# Patient Record
Sex: Male | Born: 1960 | Race: White | Hispanic: No | Smoking: Current every day smoker
Health system: Southern US, Community
[De-identification: ages and names within clinical notes are randomized; demographics above are authoritative.]

## PROBLEM LIST (undated history)

## (undated) DIAGNOSIS — H409 Unspecified glaucoma: Secondary | ICD-10-CM

## (undated) DIAGNOSIS — M549 Dorsalgia, unspecified: Secondary | ICD-10-CM

---

## 2005-05-28 ENCOUNTER — Emergency Department (HOSPITAL_COMMUNITY): Admission: EM | Admit: 2005-05-28 | Discharge: 2005-05-28 | Payer: Self-pay | Admitting: Emergency Medicine

## 2007-11-05 ENCOUNTER — Emergency Department (HOSPITAL_COMMUNITY): Admission: EM | Admit: 2007-11-05 | Discharge: 2007-11-05 | Payer: Self-pay | Admitting: Emergency Medicine

## 2008-02-23 ENCOUNTER — Emergency Department (HOSPITAL_COMMUNITY): Admission: EM | Admit: 2008-02-23 | Discharge: 2008-02-23 | Payer: Self-pay | Admitting: Emergency Medicine

## 2008-04-02 ENCOUNTER — Encounter: Admission: RE | Admit: 2008-04-02 | Discharge: 2008-04-02 | Payer: Self-pay | Admitting: Neurology

## 2008-05-07 ENCOUNTER — Encounter: Admission: RE | Admit: 2008-05-07 | Discharge: 2008-05-07 | Payer: Self-pay | Admitting: Neurology

## 2009-02-20 ENCOUNTER — Emergency Department (HOSPITAL_COMMUNITY): Admission: EM | Admit: 2009-02-20 | Discharge: 2009-02-20 | Payer: Self-pay | Admitting: Emergency Medicine

## 2009-06-26 ENCOUNTER — Emergency Department (HOSPITAL_COMMUNITY): Admission: EM | Admit: 2009-06-26 | Discharge: 2009-06-26 | Payer: Self-pay | Admitting: Emergency Medicine

## 2009-07-28 ENCOUNTER — Emergency Department (HOSPITAL_COMMUNITY): Admission: EM | Admit: 2009-07-28 | Discharge: 2009-07-28 | Payer: Self-pay | Admitting: Emergency Medicine

## 2010-05-21 ENCOUNTER — Encounter: Payer: Self-pay | Admitting: Neurology

## 2011-03-31 IMAGING — CR DG CHEST 2V
3 series · 3 of 3 positions shown · non-contrast
Comparison: 02/23/2008

CLINICAL DATA: Cough, congestion, wheezing

CHEST - 2 VIEW

[view not recorded (1 of 3)]
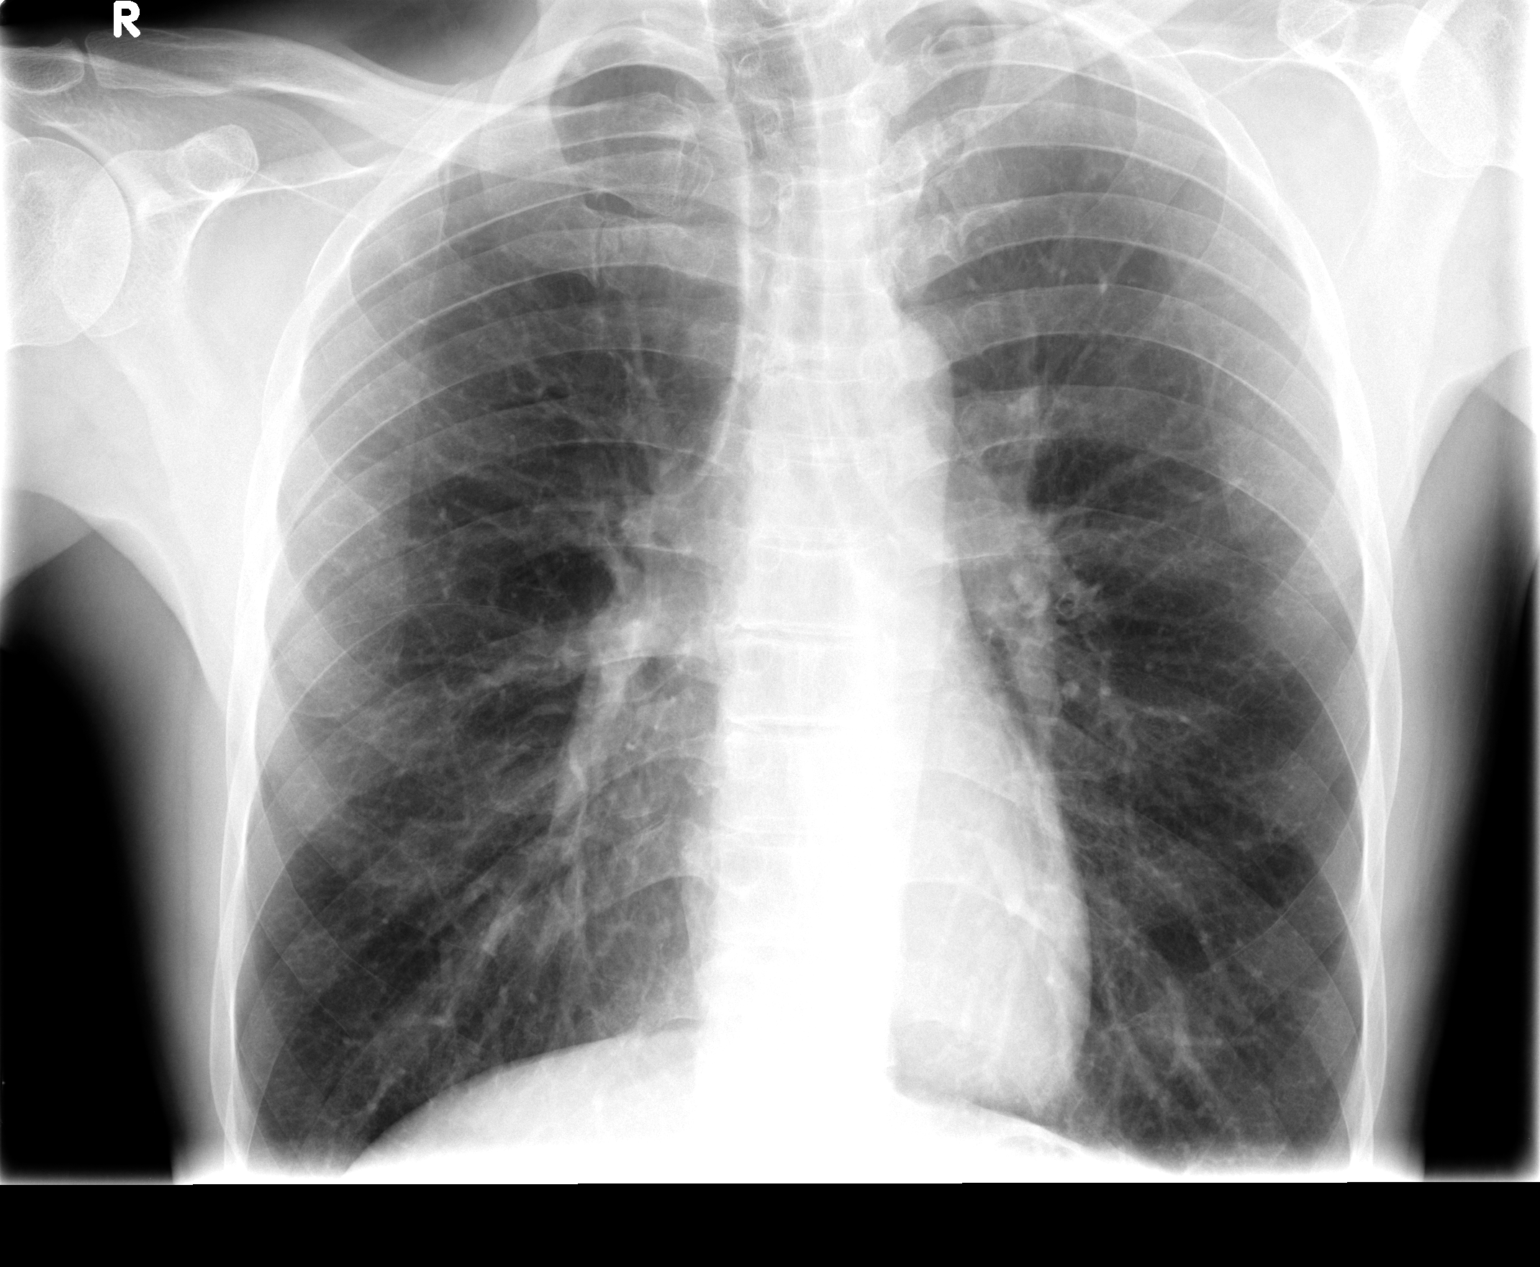

[view not recorded (2 of 3)]
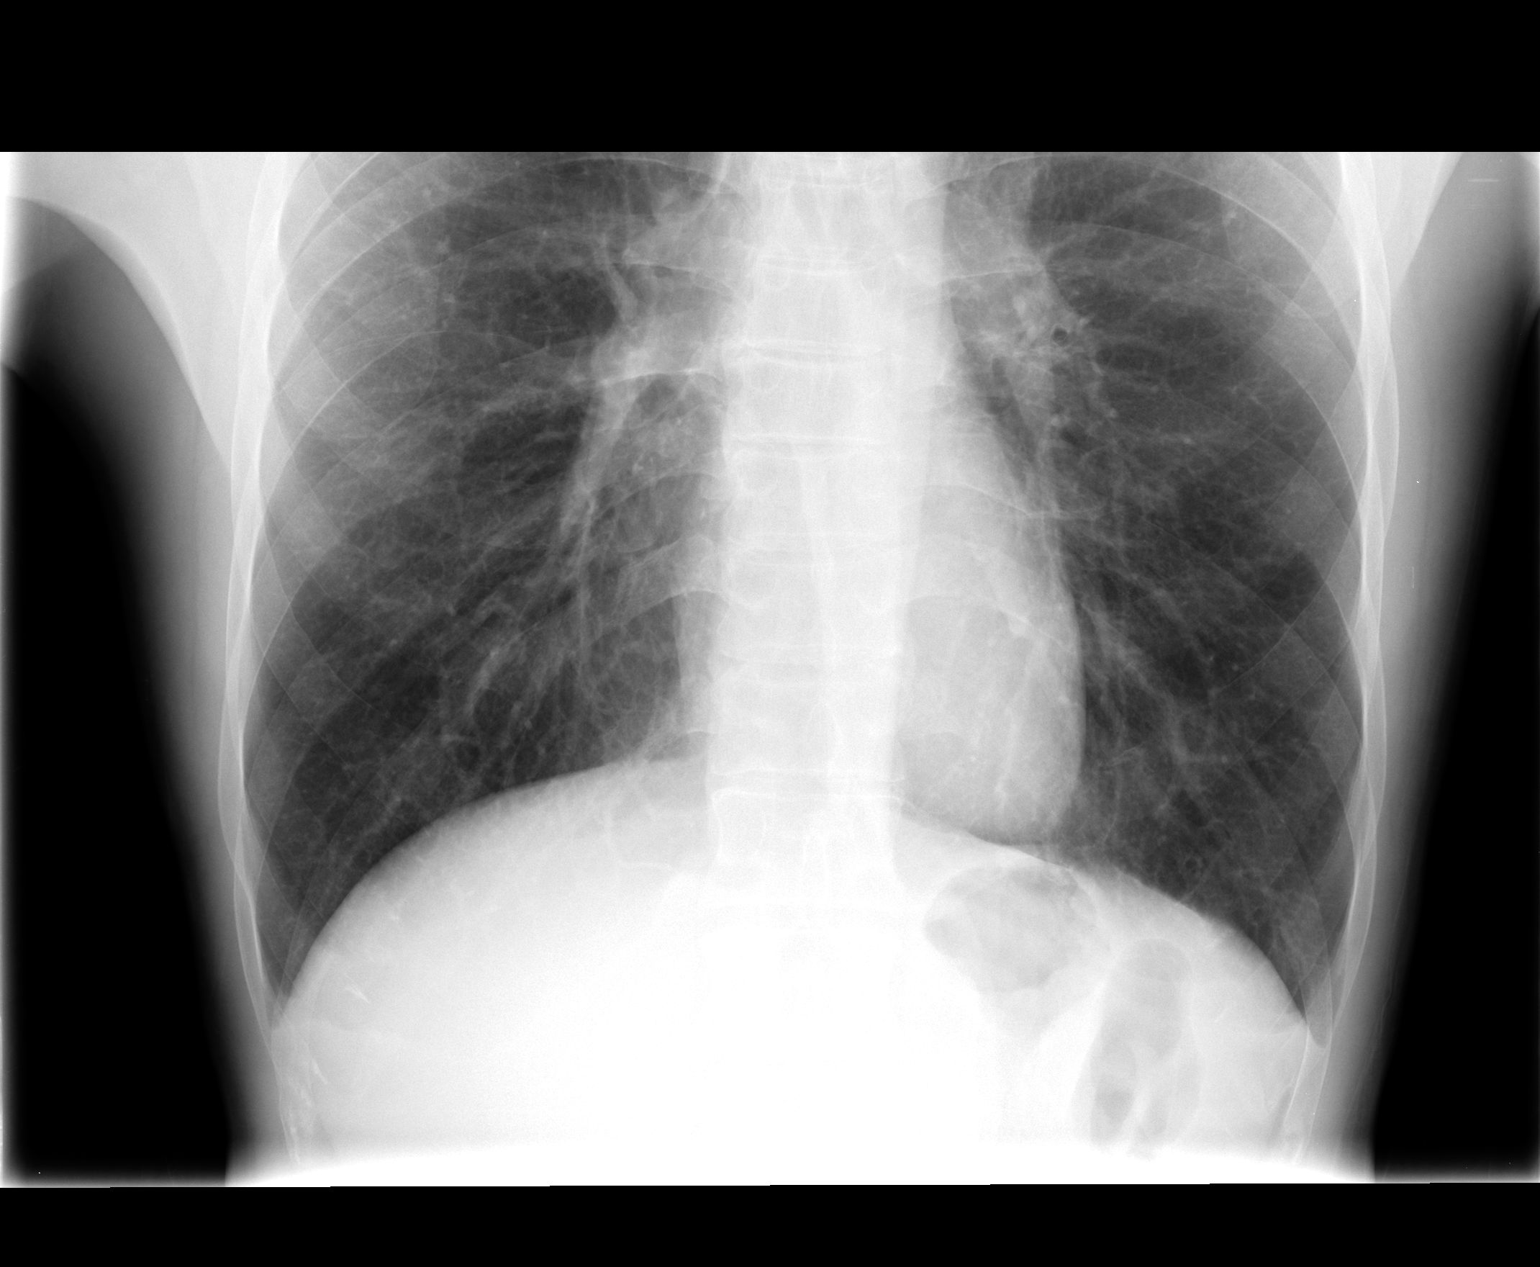

[view not recorded (3 of 3)]
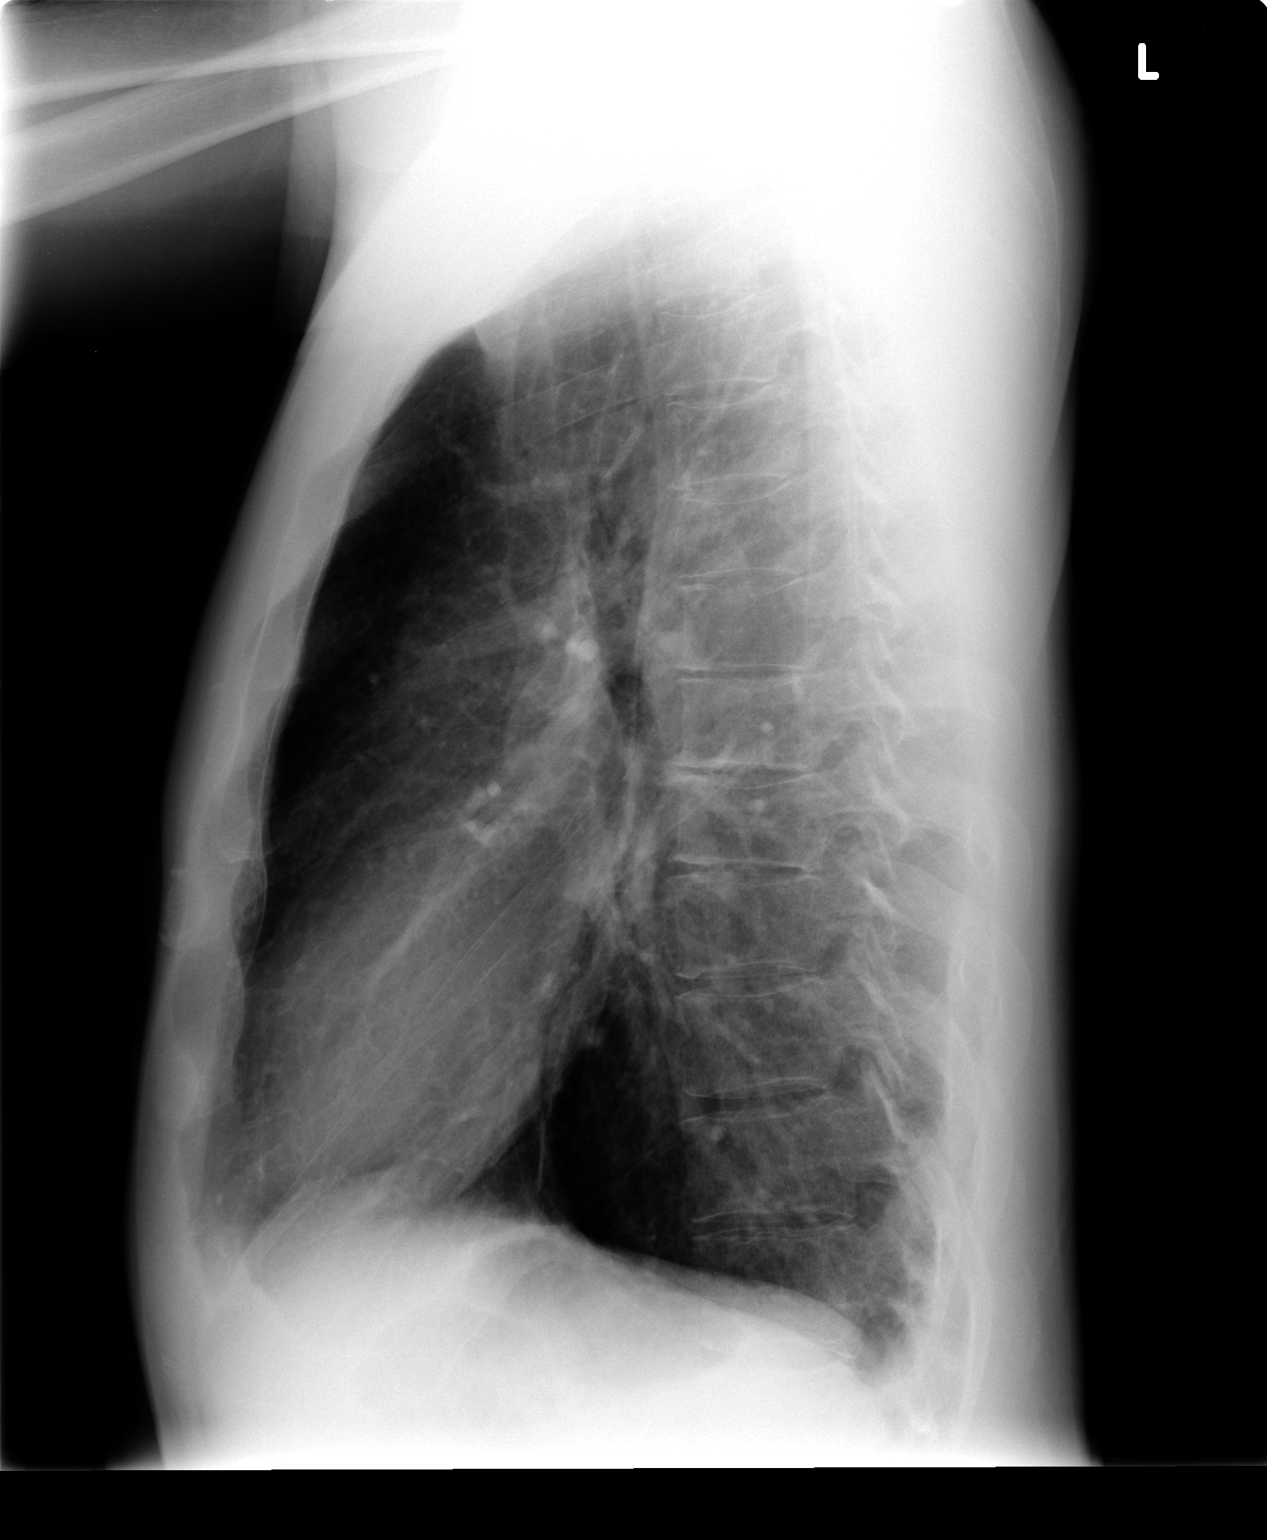

[3 of 3 positions shown; findings below may reference images not displayed]

FINDINGS: Lungs are hyperinflated.  No definite focal airspace
disease, pneumonia, consolidation, edema, effusion or pneumothorax.
Normal heart size and vascularity.
IMPRESSION: Stable hyperinflation.  No superimposed acute process

## 2011-11-07 ENCOUNTER — Emergency Department (HOSPITAL_COMMUNITY)
Admission: EM | Admit: 2011-11-07 | Discharge: 2011-11-07 | Disposition: A | Discharge status: Home / Self Care | Payer: Self-pay | Attending: Emergency Medicine | Admitting: Emergency Medicine

## 2011-11-07 ENCOUNTER — Encounter (HOSPITAL_COMMUNITY): Payer: Self-pay | Admitting: *Deleted

## 2011-11-07 DIAGNOSIS — M5430 Sciatica, unspecified side: Secondary | ICD-10-CM | POA: Insufficient documentation

## 2011-11-07 DIAGNOSIS — M5432 Sciatica, left side: Secondary | ICD-10-CM

## 2011-11-07 HISTORY — DX: Unspecified glaucoma: H40.9

## 2011-11-07 MED ORDER — OXYCODONE-ACETAMINOPHEN 5-325 MG PO TABS
1.0000 | ORAL_TABLET | Freq: Once | ORAL | Status: AC
  Administered 2011-11-07: 1 via ORAL
  Filled 2011-11-07: qty 1

## 2011-11-07 MED ORDER — CYCLOBENZAPRINE HCL 10 MG PO TABS
10.0000 mg | ORAL_TABLET | Freq: Once | ORAL | Status: AC
  Administered 2011-11-07: 10 mg via ORAL
  Filled 2011-11-07: qty 1

## 2011-11-07 MED ORDER — IBUPROFEN 800 MG PO TABS: 800.0000 mg | ORAL_TABLET | Freq: Three times a day (TID) | ORAL | Status: AC | PRN

## 2011-11-07 MED ORDER — OXYCODONE-ACETAMINOPHEN 5-325 MG PO TABS: 1.0000 | ORAL_TABLET | ORAL | Status: AC | PRN

## 2011-11-07 MED ORDER — CYCLOBENZAPRINE HCL 5 MG PO TABS: 5.0000 mg | ORAL_TABLET | Freq: Three times a day (TID) | ORAL | Status: AC | PRN

## 2011-11-07 MED ORDER — IBUPROFEN 800 MG PO TABS
800.0000 mg | ORAL_TABLET | Freq: Once | ORAL | Status: AC
  Administered 2011-11-07: 800 mg via ORAL
  Filled 2011-11-07: qty 1

## 2011-11-07 NOTE — ED Notes (Signed)
Low back pain for years, worse since yesterday.

## 2011-11-07 NOTE — ED Notes (Signed)
Pt reports hx of back pain, he moved a 12" block from yard yesterday. Describes back pain as 10/10. Has issues w/ disc in back.

## 2011-11-07 NOTE — ED Notes (Signed)
Pt alert & oriented x4, stable gait. Pt given discharge instructions, paperwork & prescription(s). Patient instructed to stop at the registration desk to finish any additional paperwork. pt verbalized understanding. Pt left department w/ no further questions.  

## 2011-11-12 NOTE — ED Provider Notes (Signed)
Medical screening examination/treatment/procedure(s) were performed by non-physician practitioner and as supervising physician I was immediately available for consultation/collaboration.   Glynn Octave, MD 11/12/11 [phone]

## 2011-11-12 NOTE — ED Provider Notes (Signed)
History     CSN: [id number]  Arrival date & time 11/07/11  8657   First MD Initiated Contact with Patient 11/07/11 1920      Chief Complaint  Patient presents with  . Back Pain    (Consider location/radiation/quality/duration/timing/severity/associated sxs/prior treatment) HPI Comments: [facility] presents with acute on chronic low back pain which has worsened since yesterday  Patient denies any new injury specifically but did lift cement block yesterday,  Then hours later developed increased pain.  There is radiation into his right upper posterior thigh.  There has been no weakness or numbness in the lower extremities and no urinary or bowel retention or incontinence.  The history is provided by the patient.    Past Medical History  Diagnosis Date  . Glaucoma     History reviewed. No pertinent past surgical history.  History reviewed. No pertinent family history.  History  Substance Use Topics  . Smoking status: Current Everyday Smoker  . Smokeless tobacco: Not on file  . Alcohol Use: No      Review of Systems  Constitutional: Negative for fever.  Respiratory: Negative for shortness of breath.   Cardiovascular: Negative for chest pain and leg swelling.  Gastrointestinal: Negative for abdominal pain, constipation and abdominal distention.  Genitourinary: Negative for dysuria, urgency, frequency, flank pain and difficulty urinating.  Musculoskeletal: Positive for back pain. Negative for joint swelling and gait problem.  Skin: Negative for rash.  Neurological: Negative for weakness and numbness.    Allergies  Review of patient's allergies indicates no known allergies.  Home Medications   Current Outpatient Rx  Name Route Sig Dispense Refill  . EYE DROPS OP Ophthalmic Apply 1 drop to eye at bedtime. PRESCRIPTION EYE DROP-NAME UNKNOWN    . CYCLOBENZAPRINE HCL 5 MG PO TABS Oral Take 1 tablet (5 mg total) by mouth 3 (three) times daily as needed for muscle  spasms. 15 tablet 0  . IBUPROFEN 800 MG PO TABS Oral Take 1 tablet (800 mg total) by mouth every 8 (eight) hours as needed for pain. 15 tablet 0  . OXYCODONE-ACETAMINOPHEN 5-325 MG PO TABS Oral Take 1 tablet by mouth every 4 (four) hours as needed for pain. 20 tablet 0    BP 138/77  Pulse 98  Temp 98.4 F (36.9 C) (Oral)  Resp 20  Ht 6\' 3"  (1.905 m)  Wt 170 lb (77.111 kg)  BMI 21.25 kg/m2  SpO2 97%  Physical Exam  Nursing note and vitals reviewed. Constitutional: He appears well-developed and well-nourished.  HENT:  Head: Normocephalic.  Eyes: Conjunctivae are normal.  Neck: Normal range of motion. Neck supple.  Cardiovascular: Normal rate and intact distal pulses.        Pedal pulses normal.  Pulmonary/Chest: Effort normal.  Abdominal: Soft. Bowel sounds are normal. He exhibits no distension and no mass.  Musculoskeletal: Normal range of motion. He exhibits no edema.       Lumbar back: He exhibits tenderness. He exhibits no swelling, no edema and no spasm.  Neurological: He is alert. He has normal strength. He displays no atrophy and no tremor. No sensory deficit. Gait normal.  Reflex Scores:      Patellar reflexes are 2+ on the right side and 2+ on the left side.      Achilles reflexes are 2+ on the right side and 2+ on the left side.      No strength deficit noted in hip and knee flexor and extensor muscle groups.  Ankle  flexion and extension intact.  Skin: Skin is warm and dry.  Psychiatric: He has a normal mood and affect.    ED Course  Procedures (including critical care time)  Labs Reviewed - No data to display No results found.   1. Sciatica of left side       MDM  Pt with acute on chronic lumbar radiculopathy.  No neuro deficit on exam or by history to suggest emergent or surgical presentation.  Pt without history of ca, no h/o IVDU.           Burgess Amor, [place] 11/12/11 [phone]

## 2017-02-03 ENCOUNTER — Emergency Department (HOSPITAL_COMMUNITY): Admission: EM | Admit: 2017-02-03 | Discharge: 2017-02-03 | Discharge status: Against Medical Advice | Payer: Self-pay

## 2019-09-08 ENCOUNTER — Emergency Department (HOSPITAL_COMMUNITY)
Admission: EM | Admit: 2019-09-08 | Discharge: 2019-09-08 | Disposition: A | Discharge status: Home / Self Care | Payer: Medicaid - Out of State | Attending: Emergency Medicine | Admitting: Emergency Medicine

## 2019-09-08 ENCOUNTER — Emergency Department (HOSPITAL_COMMUNITY): Payer: Medicaid - Out of State

## 2019-09-08 ENCOUNTER — Encounter (HOSPITAL_COMMUNITY): Payer: Self-pay

## 2019-09-08 ENCOUNTER — Other Ambulatory Visit: Payer: Self-pay

## 2019-09-08 DIAGNOSIS — M25562 Pain in left knee: Secondary | ICD-10-CM | POA: Diagnosis not present

## 2019-09-08 DIAGNOSIS — F17200 Nicotine dependence, unspecified, uncomplicated: Secondary | ICD-10-CM | POA: Insufficient documentation

## 2019-09-08 HISTORY — DX: Dorsalgia, unspecified: M54.9

## 2019-09-08 MED ORDER — NAPROXEN 375 MG PO TABS: 375.0000 mg | ORAL_TABLET | Freq: Two times a day (BID) | ORAL | 0 refills | Status: AC

## 2019-09-08 NOTE — ED Triage Notes (Signed)
Pt reports pain and swelling to left knee for the past few weeks.  Denies any injury.

## 2019-09-08 NOTE — ED Provider Notes (Signed)
[facility] Provider Note   CSN: [id number] Arrival date & time: 09/08/19  1035     History Chief Complaint  Patient presents with  . Knee Pain    [facility] is a 59 y.o. male.  HPI  59 year old male presents with left knee pain.  Patient states he has had left knee pain for many years and left back pain.  He states he is in physical therapy 3 times a week for his neck and legs and his back.  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  Since increasing his activity over the last week he has had increased pain of the left knee.  He states he had swelling of the knee which has significantly improved over this weekend with elevation of the leg.  He denies any difficulty ambulating.  At baseline he ambulates with a cane.  He denies any weakness, numbness, tingling.  He denies any redness, fevers, chills.  He denies any direct injury or trauma to the knee.     Past Medical History:  Diagnosis Date  . Back pain   . Glaucoma     There are no problems to display for this patient.   History reviewed. No pertinent surgical history.     No family history on file.  Social History   Tobacco Use  . Smoking status: Current Every Day Smoker  . Smokeless tobacco: Current User  Substance Use Topics  . Alcohol use: No  . Drug use: No    Home Medications Prior to Admission medications   Medication Sig Start Date End Date Taking? Authorizing Provider  Tetrahydrozoline HCl (EYE DROPS OP) Apply 1 drop to eye at bedtime. PRESCRIPTION EYE DROP-NAME UNKNOWN    [provider]    Allergies    Patient has no known allergies.  Review of Systems   Review of Systems  Constitutional: Negative for chills and fever.  Respiratory: Negative for shortness of breath.   Cardiovascular: Negative for chest pain.  Gastrointestinal: Negative for abdominal pain, nausea and vomiting.  Musculoskeletal:  Positive for arthralgias and joint swelling.  All other systems reviewed and are negative.   Physical Exam Updated Vital Signs BP 130/81 (BP Location: Left Arm)   Pulse 68   Temp 98.2 F (36.8 C) (Oral)   Resp 18   Ht 6\' 3"  (1.905 m)   Wt 69.6 kg   SpO2 100%   BMI 19.17 kg/m   Physical Exam Vitals and nursing note reviewed.  Constitutional:      Appearance: He is well-developed.  HENT:     Head: Normocephalic and atraumatic.  Eyes:     Conjunctiva/sclera: Conjunctivae normal.  Cardiovascular:     Rate and Rhythm: Normal rate and regular rhythm.     Heart sounds: Normal heart sounds. No murmur.  Pulmonary:     Effort: Pulmonary effort is normal. No respiratory distress.     Breath sounds: Normal breath sounds. No wheezing or rales.  Abdominal:     General: Bowel sounds are normal. There is no distension.     Palpations: Abdomen is soft.     Tenderness: There is no abdominal tenderness.  Musculoskeletal:        General: No tenderness or deformity. Normal range of motion.     Cervical back: Neck supple.     Right hip: Normal.     Left hip: Normal.     Right  upper leg: Normal.     Left upper leg: Normal.     Right knee: Normal.     Left knee: Effusion (small) and bony tenderness (minimal) present. No deformity, erythema or crepitus. Normal range of motion. No LCL laxity, MCL laxity, ACL laxity or PCL laxity.    Right lower leg: Normal.     Left lower leg: Normal.     Right ankle: Normal.     Left ankle: Normal.  Skin:    General: Skin is warm and dry.     Findings: No erythema or rash.  Neurological:     Mental Status: He is alert and oriented to person, place, and time.  Psychiatric:        Behavior: Behavior normal.     ED Results / Procedures / Treatments   Labs (all labs ordered are listed, but only abnormal results are displayed) Labs Reviewed - No data to display  EKG None  Radiology DG Knee Complete 4 Views Left  Result Date: 09/08/2019 CLINICAL  DATA:  Knee pain and swelling. EXAM: LEFT KNEE - COMPLETE 4+ VIEW COMPARISON:  None FINDINGS: Tiny suprapatellar joint effusion. Mild sharpening the tibial spines. Mild tri compartment joint space narrowing. Medial compartment marginal spur formation noted. No fracture or dislocation. IMPRESSION: 1. Small suprapatellar joint effusion. 2. Mild osteoarthritis. Electronically Signed   By: Kerby Moors M.D.   On: 09/08/2019 12:01    Procedures Procedures (including critical care time)  Medications Ordered in ED Medications - No data to display  ED Course  I have reviewed the triage vital signs and the nursing notes.  Pertinent labs & imaging results that were available during my care of the patient were reviewed by me and considered in my medical decision making (see chart for details).    MDM Rules/Calculators/A&P                      Patient presents with left knee pain, acute on chronic.  He has some mild tenderness of the left knee.  Very small joint effusion noted on physical exam.  X-ray obtained in triage shows small effusion.  No evidence of septic arthritis, gout, cellulitis, DVT on physical exam.  This is likely musculoskeletal secondary to his increased physical activity.  I discussed this in detail with the patient.  He walks with a cane at baseline and states no change in his ability to walk.  He denies any weakness.  Bounding dorsalis pedis equal bilaterally.  No evidence of arterial occlusion.  Will place patient on anti-inflammatory, he denies being on blood thinner, denies history of GI bleed or intracranial hemorrhage.  Patient ready and stable for discharge.   At this time there does not appear to be any evidence of an acute emergency medical condition and the patient appears stable for discharge with appropriate outpatient follow up.Diagnosis was discussed with patient who verbalizes understanding and is agreeable to discharge.     Final Clinical Impression(s) / ED  Diagnoses Final diagnoses:  None    Rx / DC Orders ED Discharge Orders    None       Rachel Moulds 09/08/19 2211    Isla Pence, MD 09/09/19 [phone]

## 2019-09-08 NOTE — Discharge Instructions (Signed)
Take naproxen as needed for pain.  Please follow-up with your orthopedic doctor or Dr. Romeo Apple within 1 week for continued evaluation of your left knee pain.  Return immediately for new or worsening symptoms or concerns, such as numbness, weakness, increased swelling, redness or any concerns at all.

## 2023-04-04 ENCOUNTER — Other Ambulatory Visit: Payer: Self-pay

## 2023-04-04 ENCOUNTER — Encounter (HOSPITAL_COMMUNITY): Payer: Self-pay

## 2023-04-04 ENCOUNTER — Emergency Department (HOSPITAL_COMMUNITY)
Admission: EM | Admit: 2023-04-04 | Discharge: 2023-04-04 | Discharge status: Against Medical Advice | Payer: Medicaid Other | Attending: Emergency Medicine | Admitting: Emergency Medicine

## 2023-04-04 DIAGNOSIS — K0889 Other specified disorders of teeth and supporting structures: Secondary | ICD-10-CM | POA: Insufficient documentation

## 2023-04-04 DIAGNOSIS — Z5321 Procedure and treatment not carried out due to patient leaving prior to being seen by health care provider: Secondary | ICD-10-CM | POA: Diagnosis not present

## 2023-04-04 DIAGNOSIS — F419 Anxiety disorder, unspecified: Secondary | ICD-10-CM | POA: Insufficient documentation

## 2023-04-04 DIAGNOSIS — M79671 Pain in right foot: Secondary | ICD-10-CM | POA: Insufficient documentation

## 2023-04-04 DIAGNOSIS — M791 Myalgia, unspecified site: Secondary | ICD-10-CM | POA: Diagnosis not present

## 2023-04-04 NOTE — ED Triage Notes (Signed)
Pt stated "I have a lot of problems" "I am very sick" "I am miserable"   Pt stated that "I am close to suicidal"  Pt complains of nerve pain. Tooth aches that are throbbing non stopped pain all over body.  "I need major surgery" "They going to take my thyroid out"  Pt rambling out of control in triage Pt worrying about "plates that's in my neck"  Elbow throbbing pain.   Pt very anxious and jittery. 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 Pt stated he feels like he can't bend it. Keeps repeating "I do need my blood drawn"

## 2023-04-04 NOTE — ED Notes (Signed)
Pt reports "I'm going to leave." Pt states "I have to give the car back to the person I got it from." Explained to patient that he would have to sign out against medical advice.
